# Patient Record
Sex: Male | Born: 1950 | Race: Black or African American | Hispanic: No | Marital: Single | Smoking: Former smoker
Health system: Southern US, Community
[De-identification: ages and names within clinical notes are randomized; demographics above are authoritative.]

## PROBLEM LIST (undated history)

## (undated) DIAGNOSIS — C189 Malignant neoplasm of colon, unspecified: Secondary | ICD-10-CM

## (undated) HISTORY — PX: COLON RESECTION: SHX5231

---

## 2010-07-20 ENCOUNTER — Ambulatory Visit: Payer: Self-pay | Admitting: Radiology

## 2010-07-20 ENCOUNTER — Emergency Department (HOSPITAL_BASED_OUTPATIENT_CLINIC_OR_DEPARTMENT_OTHER): Admission: EM | Admit: 2010-07-20 | Discharge: 2010-07-20 | Payer: Self-pay | Admitting: Emergency Medicine

## 2017-03-13 ENCOUNTER — Emergency Department (HOSPITAL_BASED_OUTPATIENT_CLINIC_OR_DEPARTMENT_OTHER): Payer: No Typology Code available for payment source

## 2017-03-13 ENCOUNTER — Emergency Department (HOSPITAL_BASED_OUTPATIENT_CLINIC_OR_DEPARTMENT_OTHER)
Admission: EM | Admit: 2017-03-13 | Discharge: 2017-03-13 | Disposition: A | Discharge status: Home / Self Care | Payer: No Typology Code available for payment source | Attending: Emergency Medicine | Admitting: Emergency Medicine

## 2017-03-13 ENCOUNTER — Encounter (HOSPITAL_BASED_OUTPATIENT_CLINIC_OR_DEPARTMENT_OTHER): Payer: Self-pay | Admitting: *Deleted

## 2017-03-13 DIAGNOSIS — Y92410 Unspecified street and highway as the place of occurrence of the external cause: Secondary | ICD-10-CM | POA: Diagnosis not present

## 2017-03-13 DIAGNOSIS — M545 Low back pain, unspecified: Secondary | ICD-10-CM

## 2017-03-13 DIAGNOSIS — Y999 Unspecified external cause status: Secondary | ICD-10-CM | POA: Diagnosis not present

## 2017-03-13 DIAGNOSIS — Z85038 Personal history of other malignant neoplasm of large intestine: Secondary | ICD-10-CM | POA: Insufficient documentation

## 2017-03-13 DIAGNOSIS — Y9389 Activity, other specified: Secondary | ICD-10-CM | POA: Insufficient documentation

## 2017-03-13 DIAGNOSIS — Z87891 Personal history of nicotine dependence: Secondary | ICD-10-CM | POA: Diagnosis not present

## 2017-03-13 HISTORY — DX: Malignant neoplasm of colon, unspecified: C18.9

## 2017-03-13 MED ORDER — HYDROCODONE-ACETAMINOPHEN 5-325 MG PO TABS
2.0000 | ORAL_TABLET | Freq: Once | ORAL | Status: AC
  Administered 2017-03-13: 2 via ORAL
  Filled 2017-03-13: qty 2

## 2017-03-13 MED ORDER — NAPROXEN 500 MG PO TABS: 500.0000 mg | ORAL_TABLET | Freq: Two times a day (BID) | ORAL | 0 refills | Status: AC
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
## 2017-03-13 NOTE — ED Notes (Signed)
Patient transported to X-ray 
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
## 2017-07-25 ENCOUNTER — Telehealth: Payer: Self-pay | Admitting: *Deleted

## 2017-07-25 NOTE — Telephone Encounter (Signed)
Received request for Medical Records from [place]; forwarded to [place] for email/scan/SLS 11/21

## 2020-09-26 ENCOUNTER — Encounter (HOSPITAL_BASED_OUTPATIENT_CLINIC_OR_DEPARTMENT_OTHER): Payer: Self-pay

## 2020-09-26 ENCOUNTER — Other Ambulatory Visit: Payer: Self-pay

## 2020-09-26 ENCOUNTER — Emergency Department (HOSPITAL_BASED_OUTPATIENT_CLINIC_OR_DEPARTMENT_OTHER): Payer: Medicare PPO

## 2020-09-26 ENCOUNTER — Emergency Department (HOSPITAL_BASED_OUTPATIENT_CLINIC_OR_DEPARTMENT_OTHER)
Admission: EM | Admit: 2020-09-26 | Discharge: 2020-09-26 | Disposition: A | Discharge status: Home / Self Care | Payer: Medicare PPO | Attending: Emergency Medicine | Admitting: Emergency Medicine

## 2020-09-26 DIAGNOSIS — Z85038 Personal history of other malignant neoplasm of large intestine: Secondary | ICD-10-CM | POA: Diagnosis not present

## 2020-09-26 DIAGNOSIS — Z87891 Personal history of nicotine dependence: Secondary | ICD-10-CM | POA: Insufficient documentation

## 2020-09-26 DIAGNOSIS — M25552 Pain in left hip: Secondary | ICD-10-CM | POA: Insufficient documentation

## 2020-09-26 DIAGNOSIS — M545 Low back pain, unspecified: Secondary | ICD-10-CM | POA: Insufficient documentation

## 2020-09-26 DIAGNOSIS — M48061 Spinal stenosis, lumbar region without neurogenic claudication: Secondary | ICD-10-CM

## 2020-09-26 MED ORDER — PREDNISONE 20 MG PO TABS: ORAL_TABLET | ORAL | 0 refills | Status: AC

## 2020-09-26 MED ORDER — IBUPROFEN 600 MG PO TABS: 600.0000 mg | ORAL_TABLET | Freq: Four times a day (QID) | ORAL | 0 refills | Status: AC | PRN

## 2020-09-26 MED ORDER — HYDROCODONE-ACETAMINOPHEN 5-325 MG PO TABS: 1.0000 | ORAL_TABLET | Freq: Four times a day (QID) | ORAL | 0 refills | Status: AC | PRN

## 2020-09-26 MED ORDER — HYDROCODONE-ACETAMINOPHEN 5-325 MG PO TABS
2.0000 | ORAL_TABLET | Freq: Once | ORAL | Status: AC
  Administered 2020-09-26: 2 via ORAL
  Filled 2020-09-26: qty 2

## 2020-09-26 MED ORDER — CYCLOBENZAPRINE HCL 5 MG PO TABS
5.0000 mg | ORAL_TABLET | Freq: Once | ORAL | Status: AC
  Administered 2020-09-26: 5 mg via ORAL
  Filled 2020-09-26: qty 1

## 2020-09-26 NOTE — ED Triage Notes (Signed)
Pt complaining of trouble walking due to lumbar back pain the past 2 weeks. States he is walking and has pain in his back causing his left leg to lock up. Been having back problems for years, denies known injury.

## 2020-09-26 NOTE — ED Notes (Signed)
ED Provider at bedside. 

## 2020-09-26 NOTE — ED Notes (Signed)
Patient transported to CT 

## 2020-09-26 NOTE — Discharge Instructions (Signed)
Take motrin for pain   Take vicodin for severe pain   Take prednisone as prescribed   You have spinal stenosis causing your pain. See spine doctor for follow up   Return to ER if you have worse back pain, weakness, numbness

## 2020-09-26 NOTE — ED Provider Notes (Signed)
[facility] EMERGENCY DEPARTMENT Provider Note   CSN: [id number] Arrival date & time: 09/26/20  1540     History Chief Complaint  Patient presents with  . Back Pain    Roberto Green is a 70 y.o. male history of colon cancer, chronic back pains here presenting worsening back pain.  Patient has been having back pain for several years and worsening over the last several months.  He states that for the last 2 weeks it got worse.  He states that he has shooting pain down his left leg.  Denies any trauma or injury to the back.  Denies any numbness or weakness.  Patient denies any incontinence.  He had spinal injection about 6 months ago but nothing recently.  Denies any fevers.  Has been taking Tylenol Motrin with no relief.  The history is provided by the patient.       Past Medical History:  Diagnosis Date  . Colon cancer ([place])     There are no problems to display for this patient.   Past Surgical History:  Procedure Laterality Date  . COLON RESECTION         History reviewed. No pertinent family history.  Social History   Tobacco Use  . Smoking status: Former Smoker  Substance Use Topics  . Alcohol use: No  . Drug use: No    Home Medications Prior to Admission medications   Medication Sig Start Date End Date Taking? Authorizing Provider  naproxen (NAPROSYN) 500 MG tablet Take 1 tablet (500 mg total) by mouth 2 (two) times daily. 03/13/17   [place], Barth Kirks, PA-C    Allergies    Patient has no known allergies.  Review of Systems   Review of Systems  Musculoskeletal: Positive for back pain.  All other systems reviewed and are negative.   Physical Exam Updated Vital Signs BP (!) 135/93 (BP Location: Right Arm)   Pulse (!) 103   Temp 98 F (36.7 C) (Oral)   Resp 20   Ht 5\' 7"  (1.702 m)   Wt 97.1 kg   SpO2 97%   BMI 33.52 kg/m   Physical Exam Vitals and nursing note reviewed.  Constitutional:      Appearance: Normal appearance.      Comments: Uncomfortable, chronically ill  HENT:     Head: Normocephalic.     Nose: Nose normal.     Mouth/Throat:     Mouth: Mucous membranes are moist.  Eyes:     Extraocular Movements: Extraocular movements intact.     Pupils: Pupils are equal, round, and reactive to light.  Cardiovascular:     Rate and Rhythm: Normal rate and regular rhythm.     Pulses: Normal pulses.     Heart sounds: Normal heart sounds.  Pulmonary:     Effort: Pulmonary effort is normal.     Breath sounds: Normal breath sounds.  Abdominal:     General: Abdomen is flat.     Palpations: Abdomen is soft.  Musculoskeletal:     Cervical back: Normal range of motion and neck supple.     Comments: Mild left paralumbar tenderness.  Positive straight leg raise on the left side.  No saddle anesthesia.  Patient has normal reflexes bilateral legs.  Skin:    General: Skin is warm.     Capillary Refill: Capillary refill takes less than 2 seconds.  Neurological:     General: No focal deficit present.     Mental Status: He is alert  and oriented to person, place, and time.     Comments: CN 2- 12 intact, no saddle anesthesia, normal strength with hip flexion and extension and knee flexion and extension   Psychiatric:        Mood and Affect: Mood normal.        Behavior: Behavior normal.     ED Results / Procedures / Treatments   Labs (all labs ordered are listed, but only abnormal results are displayed) Labs Reviewed - No data to display  EKG None  Radiology CT Lumbar Spine Wo Contrast  Result Date: 09/26/2020 CLINICAL DATA:  Initial evaluation for chronic low back pain with left leg pain. EXAM: CT LUMBAR SPINE WITHOUT CONTRAST TECHNIQUE: Multidetector CT imaging of the lumbar spine was performed without intravenous contrast administration. Multiplanar CT image reconstructions were also generated. COMPARISON:  Prior radiograph from 03/13/2017 FINDINGS: Segmentation: Standard. Alignment: Mild dextroscoliosis with  apex at L2-3. Trace retrolisthesis of L1 on L2 and L2 on L3. Vertebrae: Vertebral body height maintained without acute or chronic fracture. Visualized sacrum and pelvis intact. SI joints approximated symmetric. No discrete or worrisome osseous lesions. Paraspinal and other soft tissues: Paraspinous soft tissues demonstrate no acute finding. Large exophytic simple right renal cyst partially visualized. Few additional smaller right renal cysts measuring up to 3.8 cm noted as well. Mild aorto bi-iliac atherosclerotic disease. Disc levels: L1-2: Mild disc bulge, eccentric to the right. Associated mild reactive endplate spurring. Moderate left with mild right facet hypertrophy. No significant spinal stenosis. Mild bilateral foraminal narrowing. L2-3: Diffuse disc bulge with disc desiccation. Mild to moderate facet hypertrophy. Resultant mild spinal stenosis. Mild to moderate left worse than right L2 foraminal narrowing. L3-4: Diffuse disc bulge with reactive endplate spurring. Mild to moderate facet and ligament flavum hypertrophy. Resultant mild-to-moderate canal with bilateral subarticular stenosis. Moderate bilateral L3 foraminal narrowing. L4-5: Diffuse disc bulge, eccentric to the left. Moderate facet and ligament flavum hypertrophy. Resultant moderate to severe canal with left worse than right lateral recess stenosis. Severe left worse than right L4 foraminal narrowing. L5-S1: Mild disc bulge. Mild bilateral facet hypertrophy. No significant spinal stenosis. Mild left worse than right L5 foraminal narrowing. IMPRESSION: 1. No acute abnormality within the lumbar spine. 2. 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 3. Degenerative disc bulging and facet hypertrophy at L2-3 and L3-4 with resultant mild to moderate spinal stenosis, with mild to moderate bilateral L2 and  L3 foraminal narrowing, overall worse on the left. Aortic Atherosclerosis (ICD10-I70.0). Electronically Signed   By: Jeannine Boga M.D.   On: 09/26/2020 19:25    Procedures Procedures (including critical care time)  Medications Ordered in ED Medications  HYDROcodone-acetaminophen (NORCO/VICODIN) 5-325 MG per tablet 2 tablet (2 tablets Oral Given 09/26/20 1845)  cyclobenzaprine (FLEXERIL) tablet 5 mg (5 mg Oral Given 09/26/20 1845)    ED Course  I have reviewed the triage vital signs and the nursing notes.  Pertinent labs & imaging results that were available during my care of the patient were reviewed by me and considered in my medical decision making (see chart for details).    MDM Rules/Calculators/A&P                          Roberto Green is a 70 y.o. male here with back pain, L hip pain. Likely lumbar radiculopathy. No saddle anesthesia,  nl strength and sensation throughout. No need for MRI currently. Will get CT lumbar spine and give pain meds, muscle relaxants   7:31 PM CT showed L2-L3 stenosis. Will dc home with steroids, pain meds. Will refer to spine for follow up   Final Clinical Impression(s) / ED Diagnoses Final diagnoses:  None    Rx / DC Orders ED Discharge Orders    None       Drenda Freeze, MD 09/26/20 [phone]

## 2020-09-26 NOTE — ED Notes (Signed)
AVS reviewed with pt and family member (family member here to drive pt home due to medication rec for pain). Explained the three medications prescribed by the EDP, stressed the importance of taking pain medication safely, not to use alcohol or drive while using this medication, also explained to take prednisone exactly as prescribed. Opportunity for questions provided. Pt assisted to exit via wc.

## 2022-07-12 IMAGING — CT CT L SPINE W/O CM
3 series · 12 of 33 positions shown, 14 images · non-contrast
Comparison: Prior radiograph from 03/13/2017

CLINICAL DATA: Initial evaluation for chronic low back pain with
left leg pain.

EXAM:
CT LUMBAR SPINE WITHOUT CONTRAST
TECHNIQUE: Multidetector CT imaging of the lumbar spine was performed without
intravenous contrast administration. Multiplanar CT image
reconstructions were also generated.

[Series 4: l spine soft · axial · 0.39mm/px · z∈[-215,-35]mm · 4 of 132 slices shown, 5 images]
[im 21/132  soft-tissue]
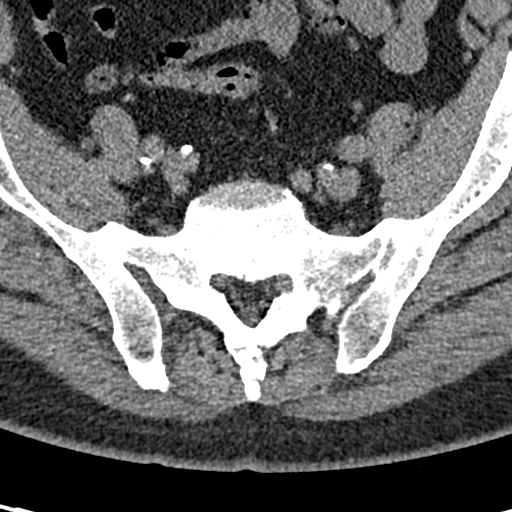
[im 21/132  bone]
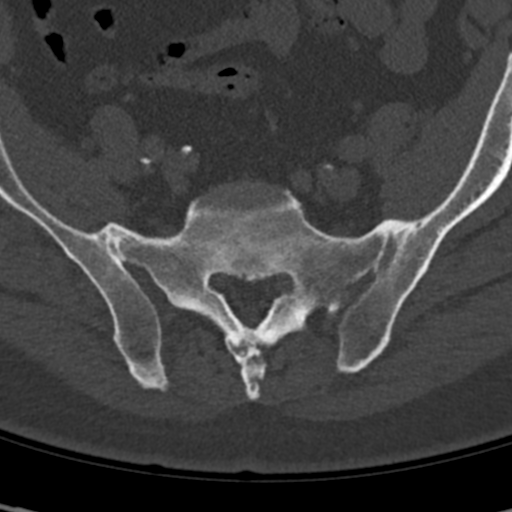
[im 51/132  bone]
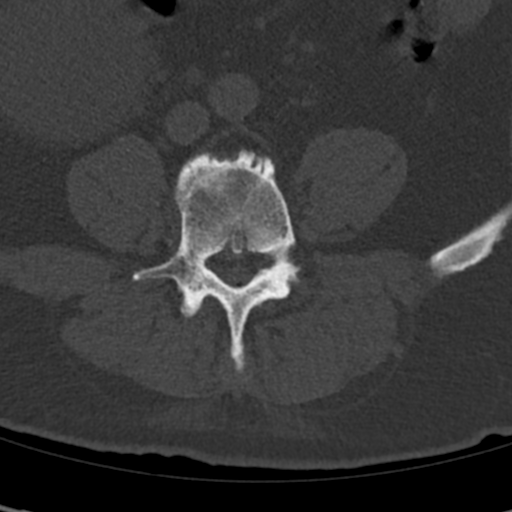
[im 81/132  bone]
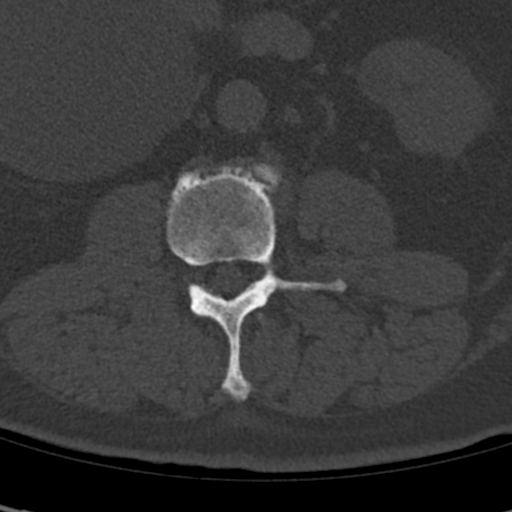
[im 111/132  bone]
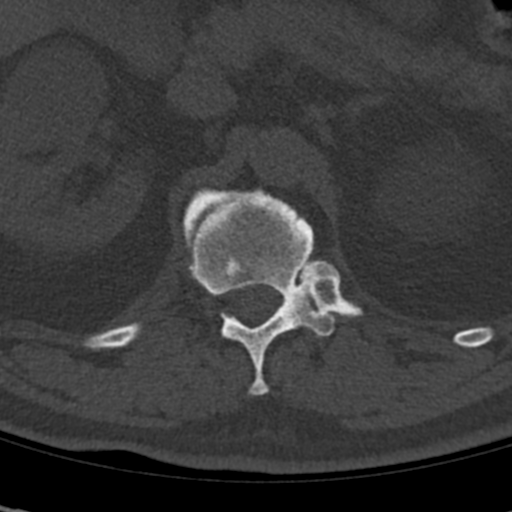

[Series 9: sagittal st · sagittal · 0.39mm/px · 5 of 74 slices shown, 6 images]
[im 25/74  bone]
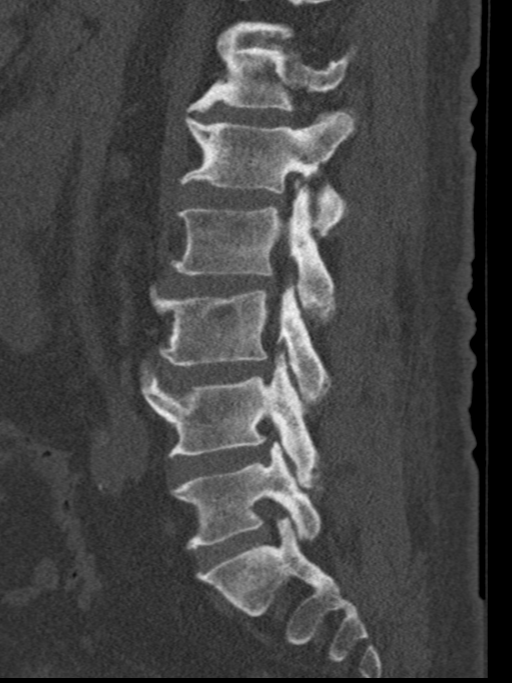
[im 31/74  bone]
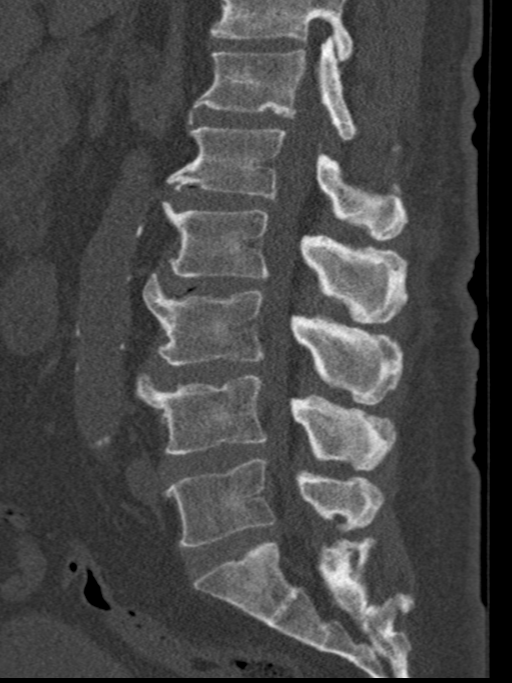
[im 37/74  soft-tissue]
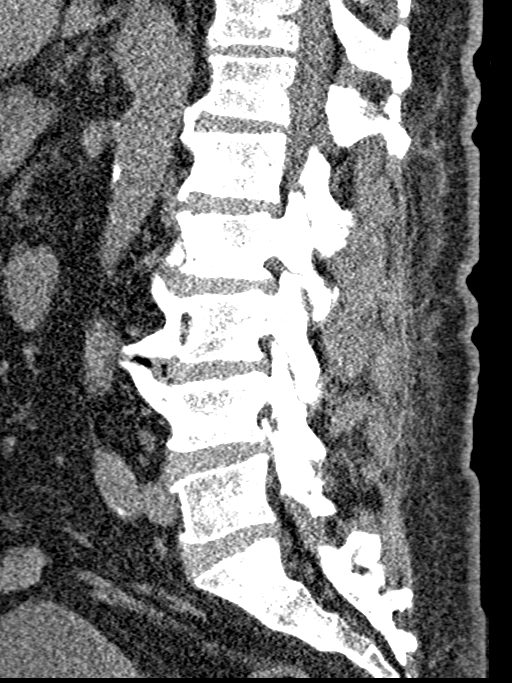
[im 37/74  bone]
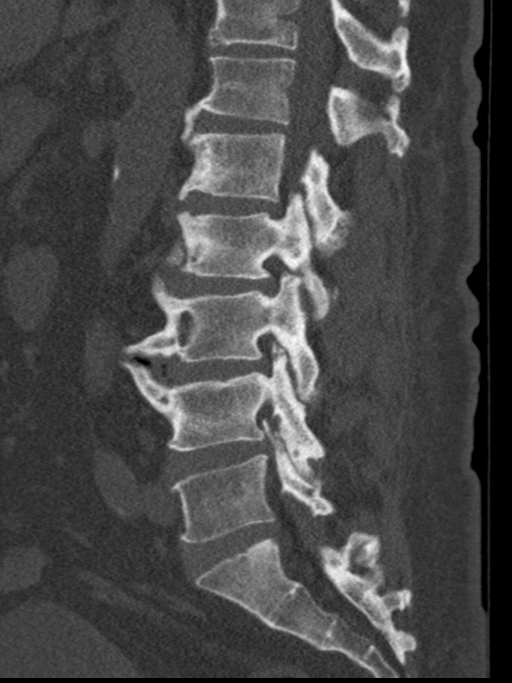
[im 43/74  bone]
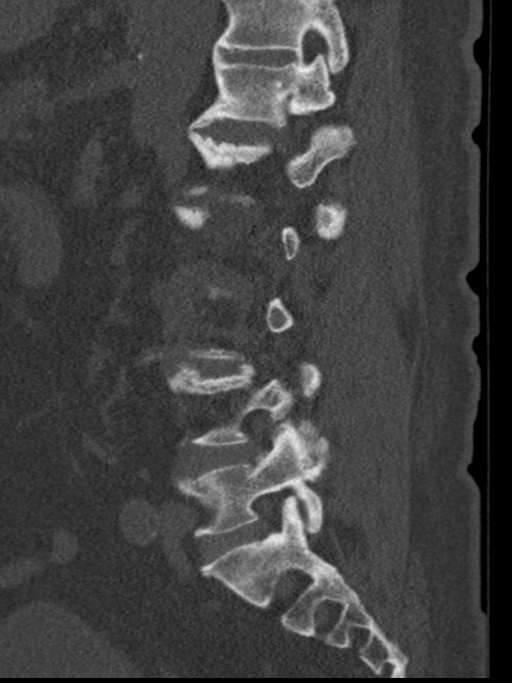
[im 49/74  bone]
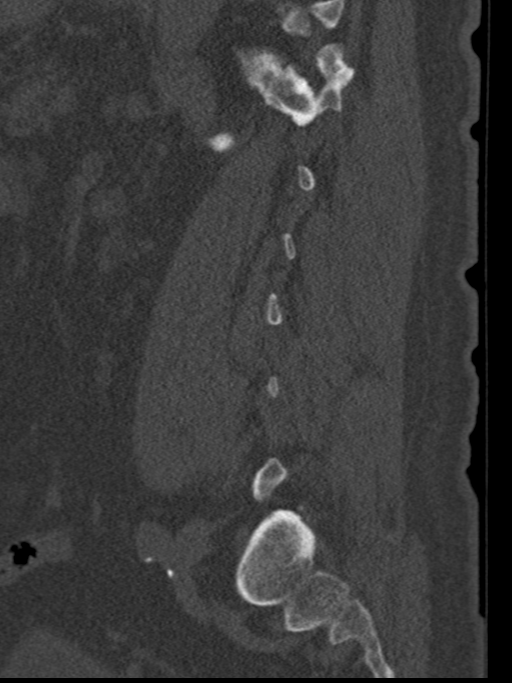

[Series 10: coronal st · coronal · 0.30mm/px · 3 of 67 slices shown]
[im 14/67  bone]
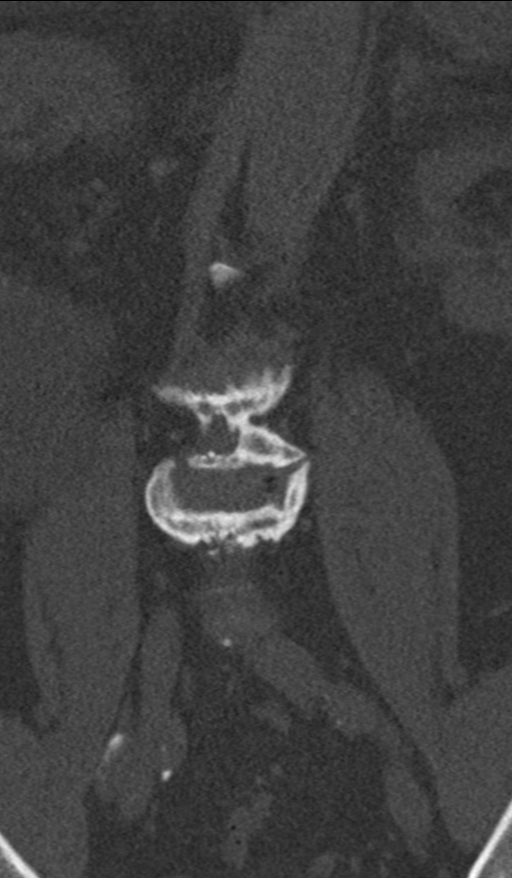
[im 27/67  bone]
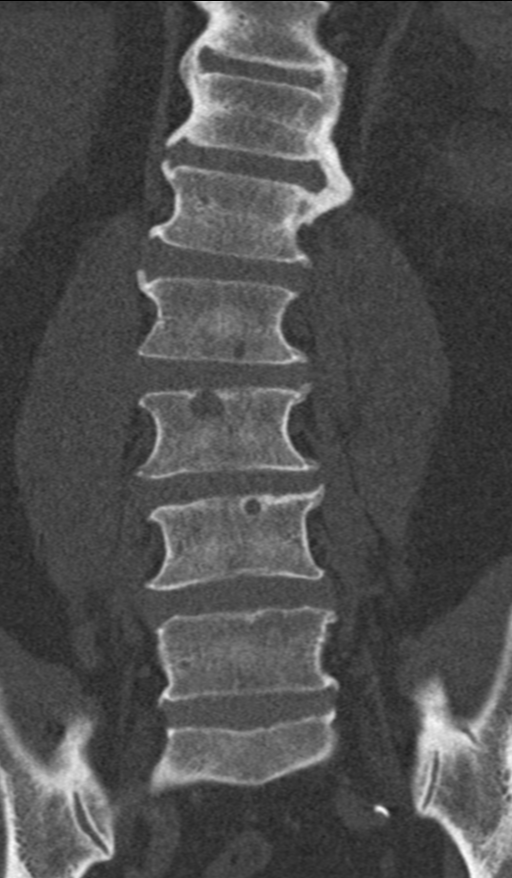
[im 40/67  bone]
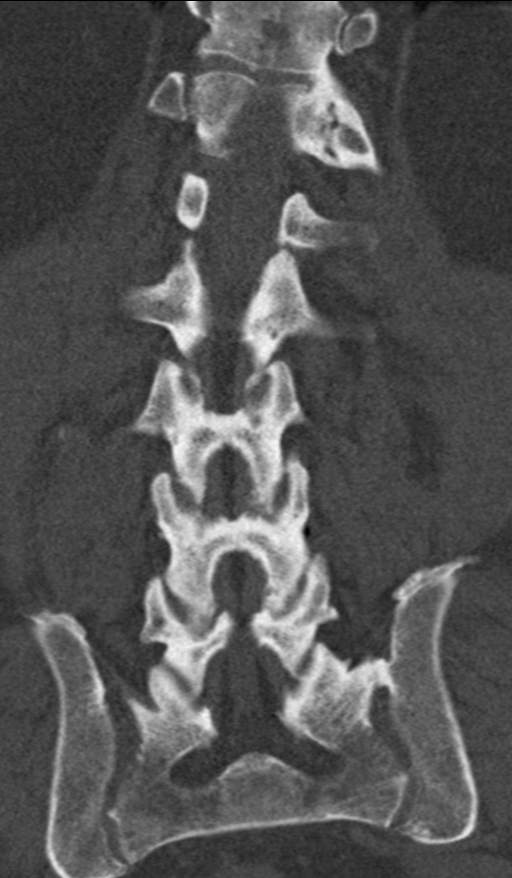

[12 of 33 positions shown; findings below may reference images not displayed]

FINDINGS: Segmentation: Standard.

Alignment: Mild dextroscoliosis with apex at L2-3. Trace
retrolisthesis of L1 on L2 and L2 on L3.

Vertebrae: Vertebral body height maintained without acute or chronic
fracture. Visualized sacrum and pelvis intact. SI joints
approximated symmetric. No discrete or worrisome osseous lesions.

Paraspinal and other soft tissues: Paraspinous soft tissues
demonstrate no acute finding. Large exophytic simple right renal
cyst partially visualized. Few additional smaller right renal cysts
measuring up to 3.8 cm noted as well. Mild aorto bi-iliac
atherosclerotic disease.

Disc levels:

L1-2: Mild disc bulge, eccentric to the right. Associated mild
reactive endplate spurring. Moderate left with mild right facet
hypertrophy. No significant spinal stenosis. Mild bilateral
foraminal narrowing.

L2-3: Diffuse disc bulge with disc desiccation. Mild to moderate
facet hypertrophy. Resultant mild spinal stenosis. Mild to moderate
left worse than right L2 foraminal narrowing.

L3-4: Diffuse disc bulge with reactive endplate spurring. Mild to
moderate facet and ligament flavum hypertrophy. Resultant
mild-to-moderate canal with bilateral subarticular stenosis.
Moderate bilateral L3 foraminal narrowing.

L4-5: Diffuse disc bulge, eccentric to the left. Moderate facet and
ligament flavum hypertrophy. Resultant moderate to severe canal with
left worse than right lateral recess stenosis. Severe left worse
than right L4 foraminal narrowing.

L5-S1: Mild disc bulge. Mild bilateral facet hypertrophy. No
significant spinal stenosis. Mild left worse than right L5 foraminal
narrowing.
IMPRESSION: 1. No acute abnormality within the lumbar spine.
2. Multifactorial degenerative changes at L4-5 with resultant
moderate to severe canal with left worse than right lateral recess
stenosis, with severe left worse than right L4 foraminal stenosis.
3. Degenerative disc bulging and facet hypertrophy at L2-3 and L3-4
with resultant mild to moderate spinal stenosis, with mild to
moderate bilateral L2 and L3 foraminal narrowing, overall worse on
the left.

Aortic Atherosclerosis ([id number]).
